# Patient Record
Sex: Female | Born: 1995 | Race: Black or African American | Hispanic: No | Marital: Single | Smoking: Never smoker
Health system: Southern US, Community
[De-identification: ages and names within clinical notes are randomized; demographics above are authoritative.]

## PROBLEM LIST (undated history)

## (undated) ENCOUNTER — Inpatient Hospital Stay (HOSPITAL_COMMUNITY): Payer: Self-pay

---

## 2017-10-24 ENCOUNTER — Encounter (HOSPITAL_COMMUNITY): Payer: Self-pay

## 2017-10-24 ENCOUNTER — Inpatient Hospital Stay (HOSPITAL_COMMUNITY)
Admission: AD | Admit: 2017-10-24 | Discharge: 2017-10-24 | Disposition: A | Discharge status: Home / Self Care | Payer: Self-pay | Source: Ambulatory Visit | Attending: Obstetrics & Gynecology | Admitting: Obstetrics & Gynecology

## 2017-10-24 ENCOUNTER — Inpatient Hospital Stay (HOSPITAL_COMMUNITY): Payer: Self-pay

## 2017-10-24 DIAGNOSIS — R109 Unspecified abdominal pain: Secondary | ICD-10-CM | POA: Insufficient documentation

## 2017-10-24 DIAGNOSIS — O4691 Antepartum hemorrhage, unspecified, first trimester: Secondary | ICD-10-CM | POA: Insufficient documentation

## 2017-10-24 DIAGNOSIS — Z3A00 Weeks of gestation of pregnancy not specified: Secondary | ICD-10-CM | POA: Insufficient documentation

## 2017-10-24 DIAGNOSIS — O4690 Antepartum hemorrhage, unspecified, unspecified trimester: Secondary | ICD-10-CM

## 2017-10-24 DIAGNOSIS — Z3A01 Less than 8 weeks gestation of pregnancy: Secondary | ICD-10-CM

## 2017-10-24 DIAGNOSIS — O26891 Other specified pregnancy related conditions, first trimester: Secondary | ICD-10-CM | POA: Insufficient documentation

## 2017-10-24 DIAGNOSIS — Z3491 Encounter for supervision of normal pregnancy, unspecified, first trimester: Secondary | ICD-10-CM

## 2017-10-24 DIAGNOSIS — O219 Vomiting of pregnancy, unspecified: Secondary | ICD-10-CM | POA: Insufficient documentation

## 2017-10-24 DIAGNOSIS — A599 Trichomoniasis, unspecified: Secondary | ICD-10-CM

## 2017-10-24 DIAGNOSIS — O98311 Other infections with a predominantly sexual mode of transmission complicating pregnancy, first trimester: Secondary | ICD-10-CM | POA: Insufficient documentation

## 2017-10-24 LAB — URINALYSIS, ROUTINE W REFLEX MICROSCOPIC
BILIRUBIN URINE: NEGATIVE
GLUCOSE, UA: NEGATIVE mg/dL
HGB URINE DIPSTICK: NEGATIVE
Ketones, ur: 20 mg/dL — AB
NITRITE: NEGATIVE
Protein, ur: 30 mg/dL — AB
Specific Gravity, Urine: 1.031 — ABNORMAL HIGH (ref 1.005–1.030)
pH: 6 (ref 5.0–8.0)

## 2017-10-24 LAB — WET PREP, GENITAL
Clue Cells Wet Prep HPF POC: NONE SEEN
Sperm: NONE SEEN
Yeast Wet Prep HPF POC: NONE SEEN

## 2017-10-24 LAB — CBC
HCT: 37.1 % (ref 36.0–46.0)
Hemoglobin: 12.5 g/dL (ref 12.0–15.0)
MCH: 25.8 pg — ABNORMAL LOW (ref 26.0–34.0)
MCHC: 33.7 g/dL (ref 30.0–36.0)
MCV: 76.7 fL — ABNORMAL LOW (ref 78.0–100.0)
Platelets: 208 10*3/uL (ref 150–400)
RBC: 4.84 MIL/uL (ref 3.87–5.11)
RDW: 14.4 % (ref 11.5–15.5)
WBC: 5.9 10*3/uL (ref 4.0–10.5)

## 2017-10-24 LAB — HCG, QUANTITATIVE, PREGNANCY: hCG, Beta Chain, Quant, S: 107820 m[IU]/mL — ABNORMAL HIGH (ref <5)

## 2017-10-24 LAB — ABO/RH: ABO/RH(D): B POS

## 2017-10-24 LAB — POCT PREGNANCY, URINE: PREG TEST UR: POSITIVE — AB

## 2017-10-24 MED ORDER — METRONIDAZOLE 500 MG PO TABS
2000.0000 mg | ORAL_TABLET | Freq: Once | ORAL | Status: AC
  Administered 2017-10-24: 2000 mg via ORAL
  Filled 2017-10-24: qty 4

## 2017-10-24 MED ORDER — PROMETHAZINE HCL 12.5 MG PO TABS: 12.5000 mg | ORAL_TABLET | Freq: Four times a day (QID) | ORAL | 1 refills | Status: AC | PRN

## 2017-10-24 NOTE — MAU Provider Note (Signed)
Chief Complaint: Abdominal Pain and Vaginal Bleeding   First Provider Initiated Contact with Patient 10/24/17 2050      SUBJECTIVE HPI: Analy Kirk is a 22 y.o. G2P0 at [redacted]w[redacted]d by LMP who presents to maternity admissions reporting vaginal bleeding and abdominal cramping. She reports abdominal pain started occurring 2 days ago as lower abdominal cramping that radiates to her back. She rates pain 8/10- has not taken any pain medication. She reports vaginal bleeding started occurring this morning and is associated with the abdominal pain. She reports heavy dark red vaginal bleeding for 2 hours. She denies vaginal bleeding currently since being in MAU. She reports seeing medium sizes clots present. She reports having a +HPT on 7/14 but was not having any cramping or bleeding when she had a +UPT. She denies vaginal itching/burning, urinary symptoms, h/a, dizziness, n/v, or fever/chills.    No past medical history on file. History reviewed. No pertinent surgical history. Social History   Socioeconomic History  . Marital status: Single    Spouse name: Not on file  . Number of children: Not on file  . Years of education: Not on file  . Highest education level: Not on file  Occupational History  . Not on file  Social Needs  . Financial resource strain: Not on file  . Food insecurity:    Worry: Not on file    Inability: Not on file  . Transportation needs:    Medical: Not on file    Non-medical: Not on file  Tobacco Use  . Smoking status: Not on file  Substance and Sexual Activity  . Alcohol use: Not on file  . Drug use: Not on file  . Sexual activity: Not on file  Lifestyle  . Physical activity:    Days per week: Not on file    Minutes per session: Not on file  . Stress: Not on file  Relationships  . Social connections:    Talks on phone: Not on file    Gets together: Not on file    Attends religious service: Not on file    Active member of club or organization: Not on file   Attends meetings of clubs or organizations: Not on file    Relationship status: Not on file  . Intimate partner violence:    Fear of current or ex partner: Not on file    Emotionally abused: Not on file    Physically abused: Not on file    Forced sexual activity: Not on file  Other Topics Concern  . Not on file  Social History Narrative  . Not on file   No current facility-administered medications on file prior to encounter.    No current outpatient medications on file prior to encounter.   Allergies not on file  ROS:  Review of Systems  Constitutional: Negative.   Respiratory: Negative.   Cardiovascular: Negative.   Gastrointestinal: Positive for abdominal pain. Negative for constipation, diarrhea, nausea and vomiting.  Genitourinary: Positive for vaginal bleeding. Negative for difficulty urinating, dysuria, frequency and urgency.  Neurological: Negative.    I have reviewed patient's Past Medical Hx, Surgical Hx, Family Hx, Social Hx, medications and allergies.   Physical Exam   Patient Vitals for the past 24 hrs:  BP Temp Temp src Pulse Resp Height Weight  10/24/17 2300 126/67 - - 85 - - -  10/24/17 1614 120/71 98.6 F (37 C) Oral 85 16 5\' 2"  (1.575 m) 174 lb (78.9 kg)   Constitutional: Well-developed, well-nourished  female in no acute distress.  Cardiovascular: normal rate Respiratory: normal effort GI: Abd soft, non-tender. Pos BS x 4 MS: Extremities nontender, no edema, normal ROM Neurologic: Alert and oriented x 4.  GU: Neg CVAT.  PELVIC EXAM: Cervix pink, visually closed, without lesion,NO  VAGINAL BLEEDING, moderate white milky discharge, vaginal walls and external genitalia normal Bimanual exam: Cervix 0/long/high, firm, anterior, neg CMT, uterus nontender, nonenlarged, adnexa without tenderness, enlargement, or mass  LAB RESULTS Results for orders placed or performed during the hospital encounter of 10/24/17 (from the past 24 hour(s))  Urinalysis, Routine w  reflex microscopic     Status: Abnormal   Collection Time: 10/24/17  5:06 PM  Result Value Ref Range   Color, Urine YELLOW YELLOW   APPearance HAZY (A) CLEAR   Specific Gravity, Urine 1.031 (H) 1.005 - 1.030   pH 6.0 5.0 - 8.0   Glucose, UA NEGATIVE NEGATIVE mg/dL   Hgb urine dipstick NEGATIVE NEGATIVE   Bilirubin Urine NEGATIVE NEGATIVE   Ketones, ur 20 (A) NEGATIVE mg/dL   Protein, ur 30 (A) NEGATIVE mg/dL   Nitrite NEGATIVE NEGATIVE   Leukocytes, UA MODERATE (A) NEGATIVE   RBC / HPF 6-10 0 - 5 RBC/hpf   WBC, UA >50 (H) 0 - 5 WBC/hpf   Bacteria, UA RARE (A) NONE SEEN   Squamous Epithelial / LPF 0-5 0 - 5   Mucus PRESENT   Pregnancy, urine POC     Status: Abnormal   Collection Time: 10/24/17  5:15 PM  Result Value Ref Range   Preg Test, Ur POSITIVE (A) NEGATIVE  CBC     Status: Abnormal   Collection Time: 10/24/17  8:58 PM  Result Value Ref Range   WBC 5.9 4.0 - 10.5 K/uL   RBC 4.84 3.87 - 5.11 MIL/uL   Hemoglobin 12.5 12.0 - 15.0 g/dL   HCT 16.137.1 09.636.0 - 04.546.0 %   MCV 76.7 (L) 78.0 - 100.0 fL   MCH 25.8 (L) 26.0 - 34.0 pg   MCHC 33.7 30.0 - 36.0 g/dL   RDW 40.914.4 81.111.5 - 91.415.5 %   Platelets 208 150 - 400 K/uL  ABO/Rh     Status: None   Collection Time: 10/24/17  8:58 PM  Result Value Ref Range   ABO/RH(D)      B POS Performed at [facility], [address] [place], [place] [address]   hCG, quantitative, pregnancy     Status: Abnormal   Collection Time: 10/24/17  8:58 PM  Result Value Ref Range   hCG, Beta Chain, Quant, S 107,820 (H) <5 mIU/mL  Wet prep, genital     Status: Abnormal   Collection Time: 10/24/17 10:04 PM  Result Value Ref Range   Yeast Wet Prep HPF POC NONE SEEN NONE SEEN   Trich, Wet Prep PRESENT (A) NONE SEEN   Clue Cells Wet Prep HPF POC NONE SEEN NONE SEEN   WBC, Wet Prep HPF POC MODERATE (A) NONE SEEN   Sperm NONE SEEN    IMAGING [place] Ob Less Than 14 Weeks With Ob Transvaginal  Result Date: 10/24/2017 CLINICAL DATA:  22 year old pregnant  female with acute pelvic pain and bleeding today. EXAM: OBSTETRIC <14 WK US AND TRANSVAGINAL OB US TECHNIQUE: Both transabdominal and transvaginal ultrasound examinations were performed for complete evaluation of the gestation as well as the maternal uterus, adnexal regions, and pelvic cul-de-sac. Transvaginal technique was performed to assess early pregnancy. COMPARISON:  None. FINDINGS: Intrauterine gestational sac: Single Yolk sac:  Visualized. Embryo:  Visualized. Cardiac Activity: Visualized. Heart Rate: 114 bpm CRL:  5.6 mm   6 w   2 d                  [place] EDC: 06/17/2018 Subchorionic hemorrhage:  None visualized. Maternal uterus/adnexae: The ovaries are unremarkable except for RIGHT corpus luteum cyst. A trace amount of free pelvic fluid noted. IMPRESSION: Single living intrauterine gestation with estimated gestational age of [redacted] weeks 2 days by this ultrasound. No evidence of subchorionic hemorrhage. Electronically Signed   By: Harmon Pier M.D.   On: 10/24/2017 21:31    MAU Management/MDM: Orders Placed This Encounter  Procedures  . Wet prep, genital  . US OB LESS THAN 14 WEEKS WITH OB TRANSVAGINAL  . Urinalysis, Routine w reflex microscopic  . CBC  . hCG, quantitative, pregnancy  . Pregnancy, urine POC  . ABO/Rh   Wet prep-positive for trich, treatment with Flagyl given in MAU ABO/Rh- B positive, no RhoGam needed GC/C-pending  Meds ordered this encounter  Medications  . metroNIDAZOLE (FLAGYL) tablet 2,000 mg  . promethazine (PHENERGAN) 12.5 MG tablet    Sig: Take 1 tablet (12.5 mg total) by mouth every 6 (six) hours as needed for nausea or vomiting.    Dispense:  30 tablet    Refill:  1    Order Specific Question:   Supervising Provider    Answer:   Willodean Rosenthal [[id number]]   Treatments in MAU included 2000 mg Flagyl for treatment of trich.  Patient reports nausea after medication.  Rx sent to pharmacy of choice for Phenergan.  Discussed importance of partner getting treatment  for STD.  Patient reports partner is living in [place] and she is unable to get prescription to him.  Discussed with patient need to inform partner of STD and refer him to local health department to receive treatment.  Educated on not having intercourse for the next 10 to 14 days after everyone is treated.  Patient verbalizes understanding. pt discharged .  Patient stable at time of discharge.  Discussed reasons to return to MAU.   ASSESSMENT 1. Trichimoniasis   2. Vaginal bleeding during pregnancy   3. Normal IUP (intrauterine pregnancy) on prenatal ultrasound, first trimester   4. [redacted] weeks gestation of pregnancy   5. Abdominal pain during pregnancy in first trimester   6. Nausea and vomiting during pregnancy     PLAN Discharge home Make appointment to be seen to initiate prenatal care List of OB providers in [place] given Notify partner of need for treatment for STD Return to MAU as needed Rx for Phenergan sent to pharmacy of choice   Allergies as of 10/24/2017   No Known Allergies     Medication List    TAKE these medications   promethazine 12.5 MG tablet Commonly known as:  PHENERGAN Take 1 tablet (12.5 mg total) by mouth every 6 (six) hours as needed for nausea or vomiting.      Steward Drone  Certified Nurse-Midwife 10/25/2017  7:05 AM

## 2017-10-24 NOTE — MAU Note (Signed)
G1P0 at unknown GA presents to MAU with bleeding starting this AM.

## 2017-10-24 NOTE — MAU Note (Signed)
Pt states she woke up this morning, passed blood clots in toilet, is cramping.  Pos HPT & pos blood test at a lab. (?)

## 2017-10-25 LAB — GC/CHLAMYDIA PROBE AMP (~~LOC~~) NOT AT ARMC
Chlamydia: NEGATIVE
Neisseria Gonorrhea: NEGATIVE

## 2018-05-15 ENCOUNTER — Other Ambulatory Visit: Payer: Self-pay

## 2018-05-15 ENCOUNTER — Emergency Department (HOSPITAL_BASED_OUTPATIENT_CLINIC_OR_DEPARTMENT_OTHER)
Admission: EM | Admit: 2018-05-15 | Discharge: 2018-05-15 | Disposition: A | Discharge status: Home / Self Care | Payer: BLUE CROSS/BLUE SHIELD | Attending: Emergency Medicine | Admitting: Emergency Medicine

## 2018-05-15 ENCOUNTER — Emergency Department (HOSPITAL_BASED_OUTPATIENT_CLINIC_OR_DEPARTMENT_OTHER): Payer: BLUE CROSS/BLUE SHIELD

## 2018-05-15 ENCOUNTER — Encounter (HOSPITAL_BASED_OUTPATIENT_CLINIC_OR_DEPARTMENT_OTHER): Payer: Self-pay | Admitting: *Deleted

## 2018-05-15 DIAGNOSIS — R1030 Lower abdominal pain, unspecified: Secondary | ICD-10-CM | POA: Diagnosis not present

## 2018-05-15 DIAGNOSIS — Z3A08 8 weeks gestation of pregnancy: Secondary | ICD-10-CM | POA: Insufficient documentation

## 2018-05-15 DIAGNOSIS — O218 Other vomiting complicating pregnancy: Secondary | ICD-10-CM | POA: Insufficient documentation

## 2018-05-15 DIAGNOSIS — O219 Vomiting of pregnancy, unspecified: Secondary | ICD-10-CM

## 2018-05-15 DIAGNOSIS — N898 Other specified noninflammatory disorders of vagina: Secondary | ICD-10-CM | POA: Diagnosis not present

## 2018-05-15 DIAGNOSIS — O9989 Other specified diseases and conditions complicating pregnancy, childbirth and the puerperium: Secondary | ICD-10-CM | POA: Diagnosis present

## 2018-05-15 LAB — URINALYSIS, ROUTINE W REFLEX MICROSCOPIC
Bilirubin Urine: NEGATIVE
GLUCOSE, UA: NEGATIVE mg/dL
Hgb urine dipstick: NEGATIVE
LEUKOCYTE UA: NEGATIVE
Nitrite: NEGATIVE
PH: 6 (ref 5.0–8.0)
Protein, ur: 30 mg/dL — AB
Specific Gravity, Urine: >1.03 — ABNORMAL HIGH (ref 1.005–1.030)

## 2018-05-15 LAB — COMPREHENSIVE METABOLIC PANEL
ALBUMIN: 4.5 g/dL (ref 3.5–5.0)
ALK PHOS: 51 U/L (ref 38–126)
ALT: 17 U/L (ref 0–44)
AST: 24 U/L (ref 15–41)
Anion gap: 13 (ref 5–15)
BILIRUBIN TOTAL: 0.5 mg/dL (ref 0.3–1.2)
BUN: 10 mg/dL (ref 6–20)
CALCIUM: 9.3 mg/dL (ref 8.9–10.3)
CO2: 21 mmol/L — ABNORMAL LOW (ref 22–32)
CREATININE: 0.56 mg/dL (ref 0.44–1.00)
Chloride: 97 mmol/L — ABNORMAL LOW (ref 98–111)
GFR calc Af Amer: >60 mL/min (ref >60)
GLUCOSE: 92 mg/dL (ref 70–99)
POTASSIUM: 3.3 mmol/L — AB (ref 3.5–5.1)
Sodium: 131 mmol/L — ABNORMAL LOW (ref 135–145)
TOTAL PROTEIN: 8.5 g/dL — AB (ref 6.5–8.1)

## 2018-05-15 LAB — CBC WITH DIFFERENTIAL/PLATELET
Abs Immature Granulocytes: 0.02 10*3/uL (ref 0.00–0.07)
BASOS PCT: 0 %
Basophils Absolute: 0 10*3/uL (ref 0.0–0.1)
EOS ABS: 0 10*3/uL (ref 0.0–0.5)
Eosinophils Relative: 0 %
HEMATOCRIT: 39.1 % (ref 36.0–46.0)
Hemoglobin: 12.5 g/dL (ref 12.0–15.0)
IMMATURE GRANULOCYTES: 0 %
Lymphocytes Relative: 20 %
Lymphs Abs: 0.9 10*3/uL (ref 0.7–4.0)
MCH: 24.6 pg — ABNORMAL LOW (ref 26.0–34.0)
MCHC: 32 g/dL (ref 30.0–36.0)
MCV: 77 fL — AB (ref 80.0–100.0)
MONO ABS: 0.5 10*3/uL (ref 0.1–1.0)
MONOS PCT: 10 %
NEUTROS PCT: 70 %
Neutro Abs: 3.1 10*3/uL (ref 1.7–7.7)
Platelets: 239 10*3/uL (ref 150–400)
RBC: 5.08 MIL/uL (ref 3.87–5.11)
RDW: 13.4 % (ref 11.5–15.5)
WBC: 4.5 10*3/uL (ref 4.0–10.5)
nRBC: 0 % (ref 0.0–0.2)

## 2018-05-15 LAB — URINALYSIS, MICROSCOPIC (REFLEX)

## 2018-05-15 LAB — WET PREP, GENITAL
Clue Cells Wet Prep HPF POC: NONE SEEN
SPERM: NONE SEEN
TRICH WET PREP: NONE SEEN
YEAST WET PREP: NONE SEEN

## 2018-05-15 LAB — LIPASE, BLOOD: LIPASE: 35 U/L (ref 11–51)

## 2018-05-15 LAB — HCG, QUANTITATIVE, PREGNANCY: HCG, BETA CHAIN, QUANT, S: 267868 m[IU]/mL — AB (ref <5)

## 2018-05-15 MED ORDER — ONDANSETRON HCL 4 MG/2ML IJ SOLN
4.0000 mg | Freq: Once | INTRAMUSCULAR | Status: AC
  Administered 2018-05-15: 4 mg via INTRAVENOUS
  Filled 2018-05-15: qty 2

## 2018-05-15 MED ORDER — SODIUM CHLORIDE 0.9 % IV BOLUS
1000.0000 mL | Freq: Once | INTRAVENOUS | Status: AC
  Administered 2018-05-15: 1000 mL via INTRAVENOUS

## 2018-05-15 MED ORDER — DOXYLAMINE-PYRIDOXINE 10-10 MG PO TBEC: 1.0000 | DELAYED_RELEASE_TABLET | Freq: Three times a day (TID) | ORAL | 0 refills | Status: AC | PRN

## 2018-05-15 NOTE — ED Triage Notes (Signed)
Vomiting x 2 weeks. She is [redacted] weeks pregnant. She is taking prenatal vitamins. She is scheduled for an [place] in 2 days. Denies abdominal pain.

## 2018-05-15 NOTE — ED Provider Notes (Signed)
Emergency Department Provider Note   I have reviewed the triage vital signs and the nursing notes.   HISTORY  Chief Complaint Emesis During Pregnancy   HPI Vicki Kirk is a 23 y.o. female presents to the emergency department for evaluation of nausea and vomiting in early pregnancy.  Patient has had 2 weeks of symptoms.  She is G2 P0 at approximately 8 weeks by dates.  She terminated her last pregnancy. She has an appointment scheduled with OB but has not seen a provider as of yet.  She denies any vaginal bleeding but has had some increased discharge which she describes as white and thick.  She describes "hunger pains" but denies specific lower abdominal discomfort.  No dysuria, hesitancy, urgency.  No back pain.  No diarrhea.  No radiation of symptoms or modifying factors.  History reviewed. No pertinent past medical history.  There are no active problems to display for this patient.   History reviewed. No pertinent surgical history.  Allergies Bactrim [sulfamethoxazole-trimethoprim]  No family history on file.  Social History Social History   Tobacco Use  . Smoking status: Never Smoker  . Smokeless tobacco: Never Used  Substance Use Topics  . Alcohol use: Not Currently  . Drug use: Not Currently    Review of Systems  Constitutional: No fever/chills Eyes: No visual changes. ENT: No sore throat. Cardiovascular: Denies chest pain. Respiratory: Denies shortness of breath. Gastrointestinal: Positive lower abdominal pain.  Positive nausea and vomiting.  No diarrhea.  No constipation. Genitourinary: Negative for dysuria. Musculoskeletal: Negative for back pain. Skin: Negative for rash. Neurological: Negative for headaches, focal weakness or numbness.  10-point ROS otherwise negative.  ____________________________________________   PHYSICAL EXAM:  VITAL SIGNS: ED Triage Vitals  Enc Vitals Group     BP 05/15/18 1325 126/69     Pulse Rate 05/15/18 1325 84      Resp 05/15/18 1325 16     Temp 05/15/18 1325 98.1 F (36.7 C)     Temp Source 05/15/18 1325 Oral     SpO2 05/15/18 1325 100 %     Weight 05/15/18 1327 175 lb (79.4 kg)     Height 05/15/18 1327 5\' 2"  (1.575 m)   Constitutional: Alert and oriented. Well appearing and in no acute distress. Eyes: Conjunctivae are normal. Head: Atraumatic. Nose: No congestion/rhinnorhea. Mouth/Throat: Mucous membranes are moist.  Neck: No stridor.   Cardiovascular: Normal rate, regular rhythm. Good peripheral circulation. Grossly normal heart sounds.   Respiratory: Normal respiratory effort.  No retractions. Lungs CTAB. Gastrointestinal: Soft and nontender. No distention.  Genitourinary: Exam performed with verbal consent and chaperone present. Mild discharge. Normal appearing, closed cervix. No bleeding appreciated.  Musculoskeletal: No lower extremity tenderness nor edema. No gross deformities of extremities. Neurologic:  Normal speech and language. No gross focal neurologic deficits are appreciated.  Skin:  Skin is warm, dry and intact. No rash noted.  ____________________________________________   LABS (all labs ordered are listed, but only abnormal results are displayed)  Labs Reviewed  WET PREP, GENITAL - Abnormal; Notable for the following components:      Result Value   WBC, Wet Prep HPF POC MODERATE (*)    All other components within normal limits  COMPREHENSIVE METABOLIC PANEL - Abnormal; Notable for the following components:   Sodium 131 (*)    Potassium 3.3 (*)    Chloride 97 (*)    CO2 21 (*)    Total Protein 8.5 (*)    All other components within  normal limits  CBC WITH DIFFERENTIAL/PLATELET - Abnormal; Notable for the following components:   MCV 77.0 (*)    MCH 24.6 (*)    All other components within normal limits  URINALYSIS, ROUTINE W REFLEX MICROSCOPIC - Abnormal; Notable for the following components:   APPearance HAZY (*)    Specific Gravity, Urine >1.030 (*)     Ketones, ur >80 (*)    Protein, ur 30 (*)    All other components within normal limits  HCG, QUANTITATIVE, PREGNANCY - Abnormal; Notable for the following components:   hCG, Beta Francene Finders 789,381 (*)    All other components within normal limits  URINALYSIS, MICROSCOPIC (REFLEX) - Abnormal; Notable for the following components:   Bacteria, UA RARE (*)    All other components within normal limits  LIPASE, BLOOD  GC/CHLAMYDIA PROBE AMP ([place]) NOT AT [facility]   ____________________________________________  RADIOLOGY  US Ob Comp < 14 Wks  Result Date: 05/15/2018 CLINICAL DATA:  Initial evaluation for acute vomiting, right lower quadrant tenderness, early pregnancy. EXAM: OBSTETRIC <14 WK ULTRASOUND TECHNIQUE: Transabdominal ultrasound was performed for evaluation of the gestation as well as the maternal uterus and adnexal regions. COMPARISON:  None available. FINDINGS: Intrauterine gestational sac: Single Yolk sac:  Present Embryo:  Present Cardiac Activity: Present Heart Rate: 168 bpm CRL: 15.8 mm   8 w 0 d                  [place] EDC: 12/25/2018 Subchorionic hemorrhage:  None visualized. Maternal uterus/adnexae: Left ovary normal in appearance. Corpus luteal cyst noted within the right ovary. No free fluid within the pelvis. IMPRESSION: 1. Single viable intrauterine pregnancy as above without complication, estimated gestational age [redacted] weeks and 0 days by crown-rump length, with ultrasound EDC of 12/25/2018. 2. Small right ovarian corpus luteal cyst, which could contribute to acute right lower quadrant pain. No other acute maternal uterine or adnexal abnormality identified. Electronically Signed   By: Rise Mu M.D.   On: 05/15/2018 17:46    ____________________________________________   PROCEDURES  Procedure(s) performed:   Procedures  None ____________________________________________   INITIAL IMPRESSION / ASSESSMENT AND PLAN / ED COURSE  Pertinent labs & imaging  results that were available during my care of the patient were reviewed by me and considered in my medical decision making (see chart for details).  Patient presents with nausea, vomiting for the past 2 weeks.  No diarrhea.  Patient is well-appearing.  She does have some mild right lower quadrant tenderness on my abdominal exam although is not complaining of specific abdominal pain.  Plan for pelvic exam given her report of increased discharge.  Will obtain screening labs including beta quant and perform ultrasound to establish intrauterine pregnancy.   IUP on TVUS. Patient feeling improved after Zofran and IVF. BP normal. Discharged home with Diclegis and discussed that this is a combination of OTC meds if Rx is expensive. Advised very close OB follow up.  ____________________________________________  FINAL CLINICAL IMPRESSION(S) / ED DIAGNOSES  Final diagnoses:  Nausea and vomiting in pregnancy  Lower abdominal pain     MEDICATIONS GIVEN DURING THIS VISIT:  Medications  sodium chloride 0.9 % bolus 1,000 mL ( Intravenous Stopped 05/15/18 1708)  ondansetron (ZOFRAN) injection 4 mg (4 mg Intravenous Given 05/15/18 1537)     NEW OUTPATIENT MEDICATIONS STARTED DURING THIS VISIT:  Discharge Medication List as of 05/15/2018  6:44 PM    START taking these medications   Details  Doxylamine-Pyridoxine  10-10 MG TBEC Take 1 tablet by mouth every 8 (eight) hours as needed (nausea and vomiting)., Starting Mon 05/15/2018, Print        Note:  This document was prepared using Dragon voice recognition software and may include unintentional dictation errors.  Alona Bene, MD Emergency Medicine    Long, Arlyss Repress, MD 05/16/18 [phone]

## 2018-05-15 NOTE — Discharge Instructions (Signed)
You have been seen in the Emergency Department (ED) today for nausea and vomiting.  Your work up today has not shown a clear cause for your symptoms. ?You have been prescribed Diclegis; please use as prescribed as needed for your nausea. ? ?Follow up with your doctor as soon as possible regarding today?s emergent visit and your symptoms of nausea.  ? ?Return to the ED if you develop abdominal, bloody vomiting, bloody diarrhea, if you are unable to tolerate fluids due to vomiting, or if you develop other symptoms that concern you. ? ?

## 2018-05-17 LAB — GC/CHLAMYDIA PROBE AMP (~~LOC~~) NOT AT ARMC
CHLAMYDIA, DNA PROBE: NEGATIVE
NEISSERIA GONORRHEA: NEGATIVE

## 2018-09-06 ENCOUNTER — Encounter (HOSPITAL_COMMUNITY): Payer: Self-pay

## 2019-07-19 IMAGING — US US OB COMP LESS 14 WK
1 series · 14 of 19 positions shown · non-contrast
Comparison: None available.

CLINICAL DATA: Initial evaluation for acute vomiting, right lower
quadrant tenderness, early pregnancy.

EXAM:
OBSTETRIC <14 WK ULTRASOUND
TECHNIQUE: Transabdominal ultrasound was performed for evaluation of the
gestation as well as the maternal uterus and adnexal regions.

[Series 1: us ob comp less 14 wk · 0.09mm/px · 19 acquisitions, 14 frames shown]
[im 1/19]
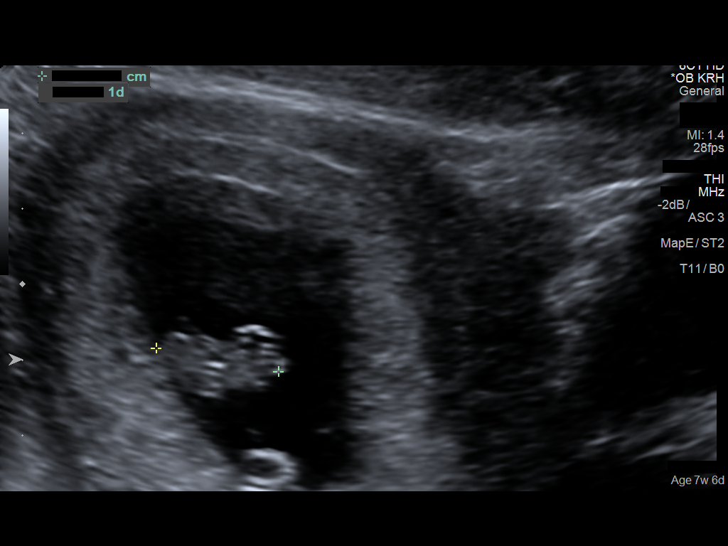
[im 3/19]
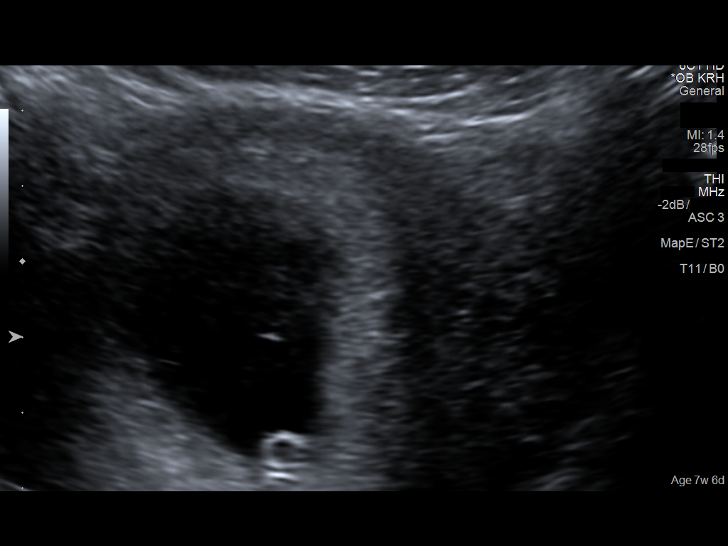
[im 4/19]
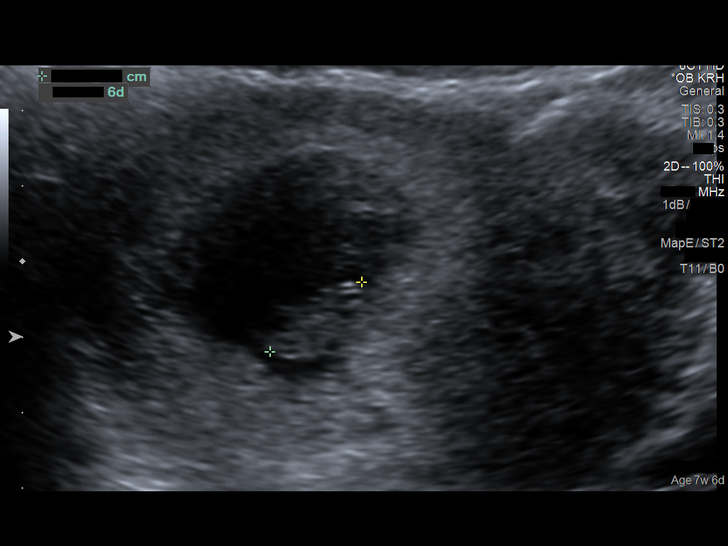
[im 5/19]
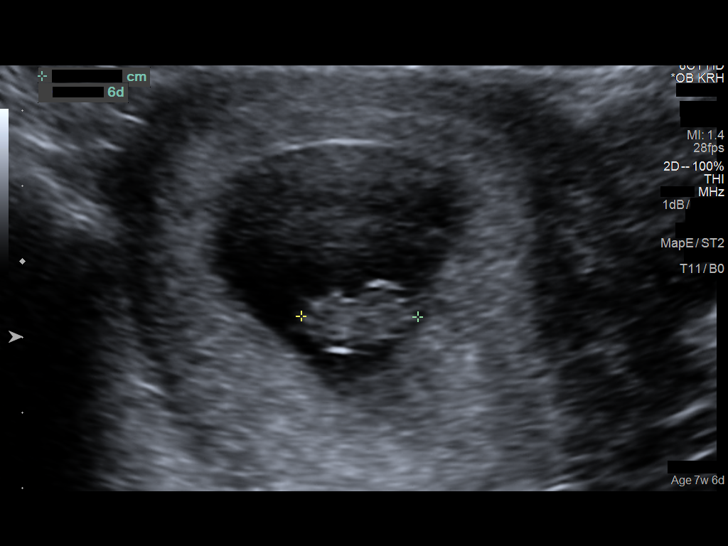
[im 7/19]
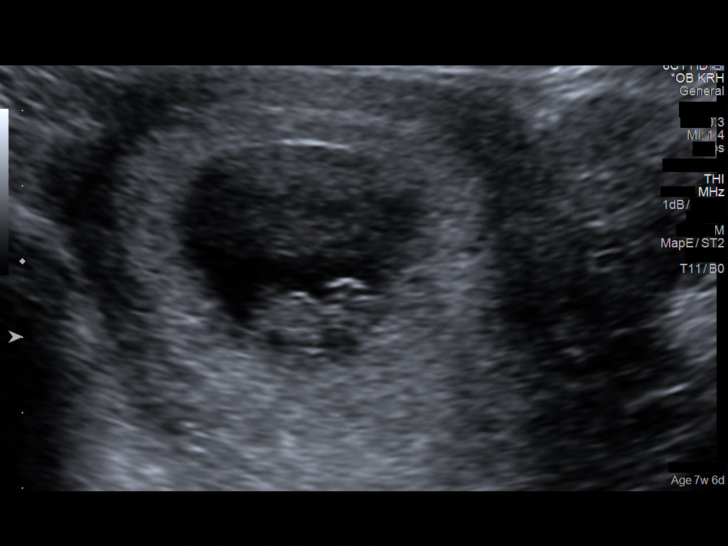
[im 8/19]
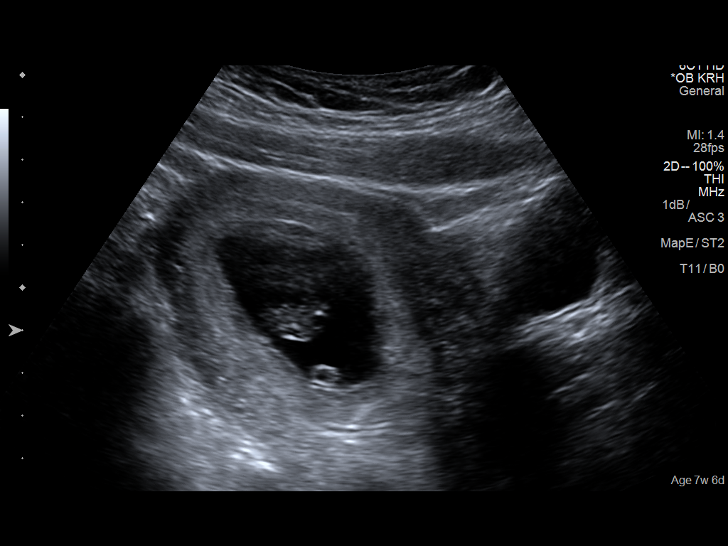
[im 9/19]
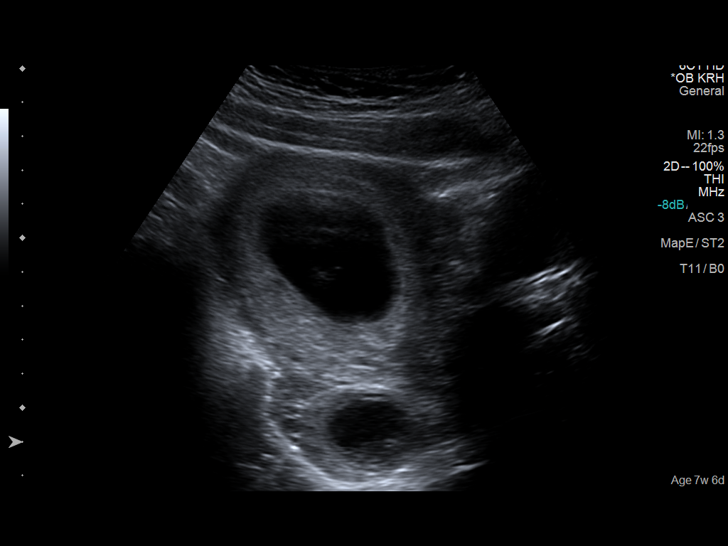
[im 11/19]
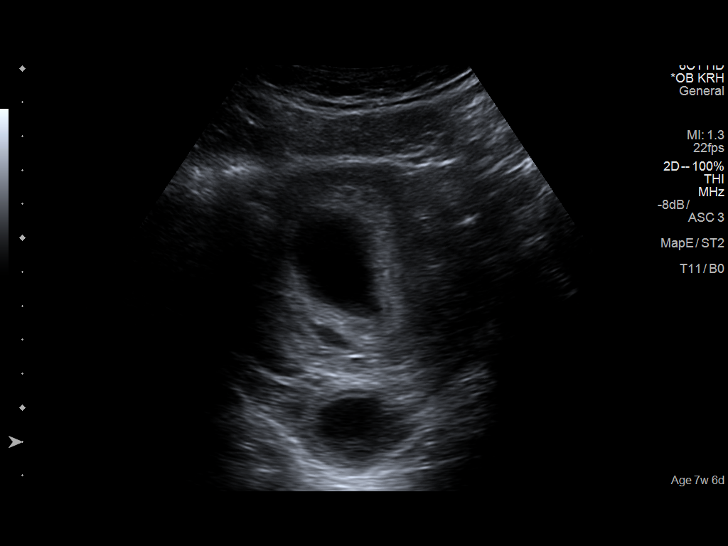
[im 12/19]
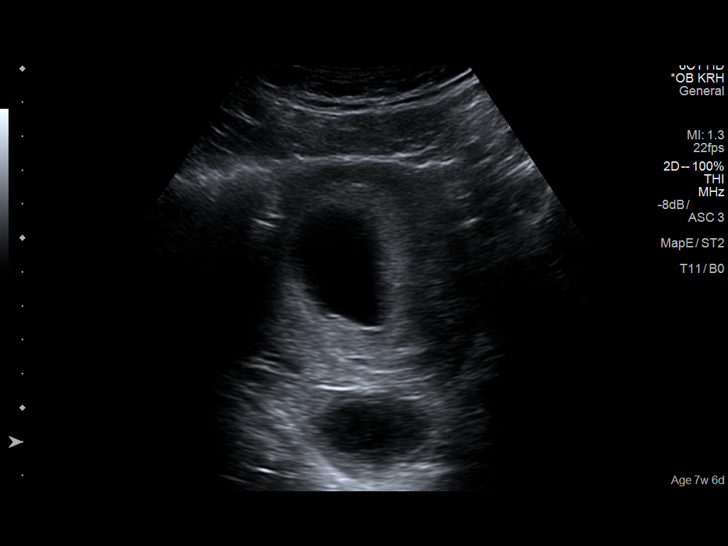
[im 13/19]
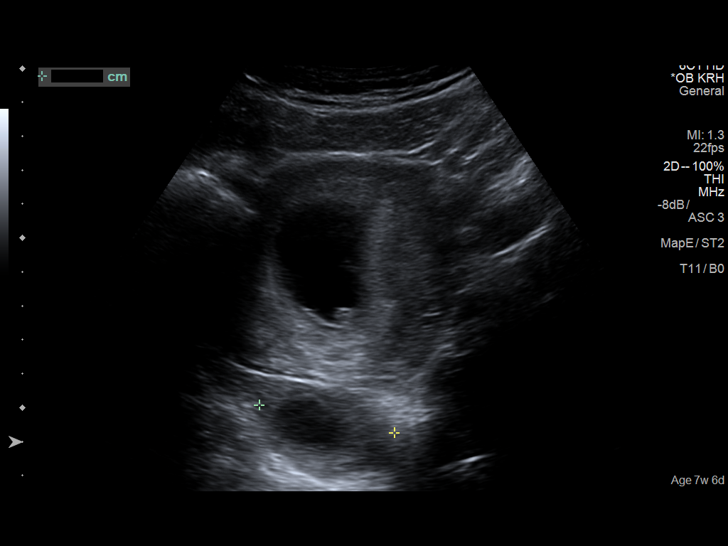
[im 15/19]
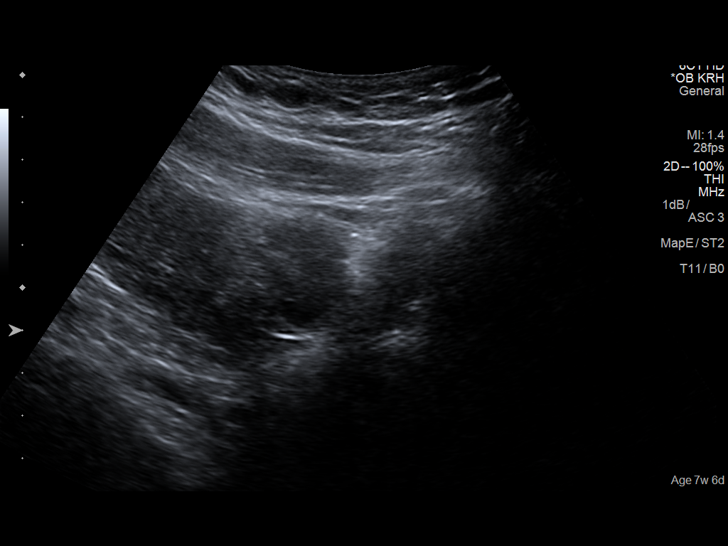
[im 16/19]
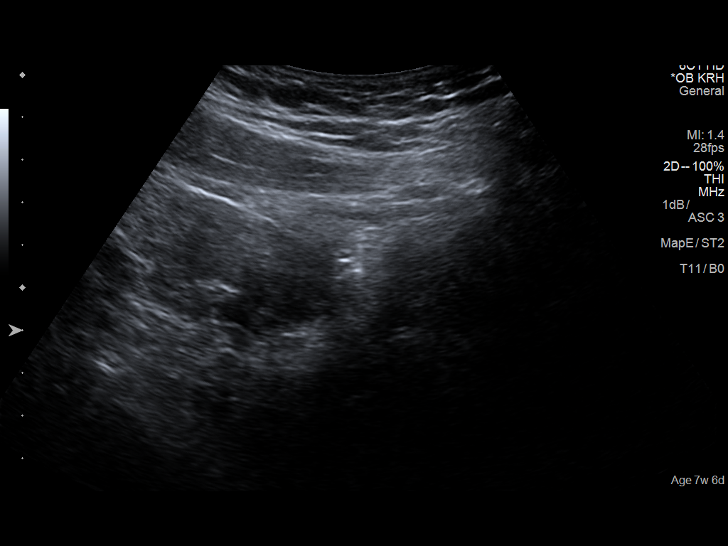
[im 17/19]
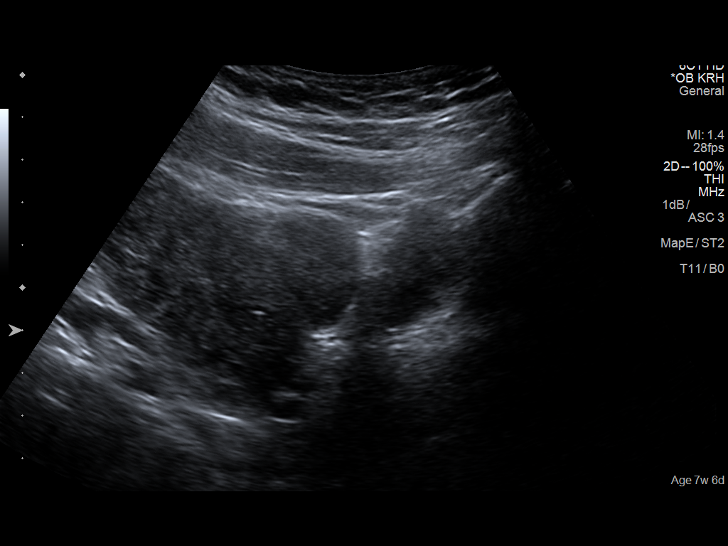
[im 19/19]
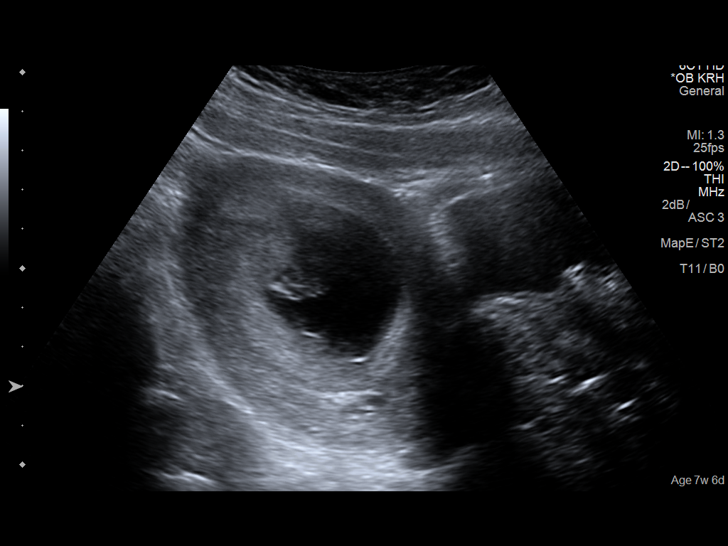

[14 of 19 positions shown; findings below may reference images not displayed]

FINDINGS: Intrauterine gestational sac: Single

Yolk sac:  Present

Embryo:  Present

Cardiac Activity: Present

Heart Rate: 168 bpm

CRL: 15.8 mm   8 w 0 d                  US EDC: 12/25/2018

Subchorionic hemorrhage:  None visualized.

Maternal uterus/adnexae: Left ovary normal in appearance. Corpus
luteal cyst noted within the right ovary. No free fluid within the
pelvis.
IMPRESSION: 1. Single viable intrauterine pregnancy as above without
complication, estimated gestational age 8 weeks and 0 days by
crown-rump length, with ultrasound EDC of 12/25/2018.
2. Small right ovarian corpus luteal cyst, which could contribute to
acute right lower quadrant pain. No other acute maternal uterine or
adnexal abnormality identified.

## 2019-09-09 IMAGING — US US OB < 14 WEEKS - US OB TV
1 series · 15 of 28 positions shown · non-contrast
Comparison: None.

CLINICAL DATA: 22-year-old pregnant female with acute pelvic pain
and bleeding today.

EXAM:
OBSTETRIC <14 WK US AND TRANSVAGINAL OB US
TECHNIQUE: Both transabdominal and transvaginal ultrasound examinations were
performed for complete evaluation of the gestation as well as the
maternal uterus, adnexal regions, and pelvic cul-de-sac.
Transvaginal technique was performed to assess early pregnancy.

[Series 1: us ob < 14 weeks - us ob tv · 15 of 54 slices shown]
[im 1/54]
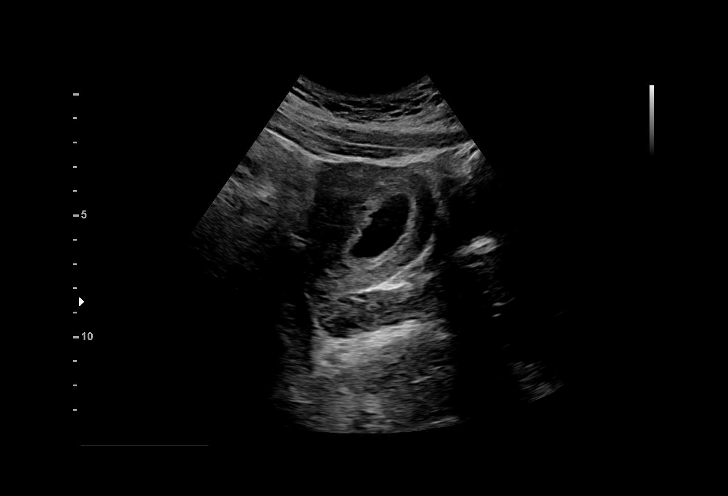
[im 4/54]
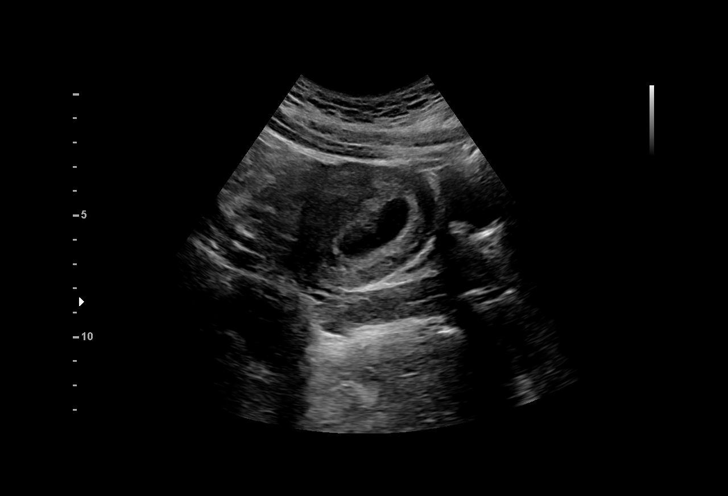
[im 8/54]
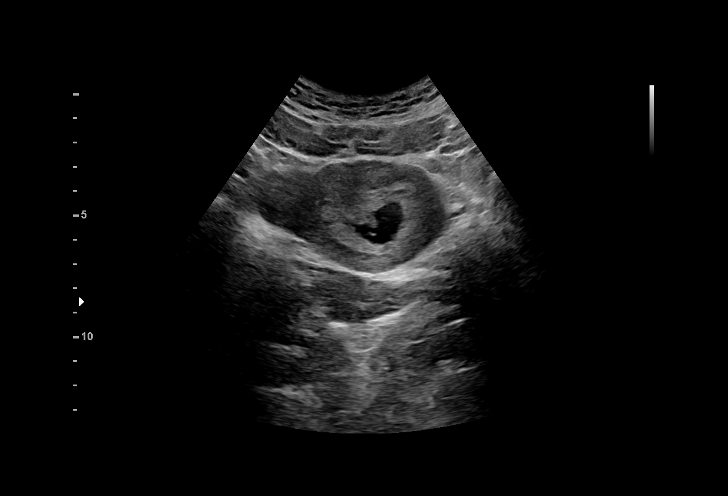
[im 12/54]
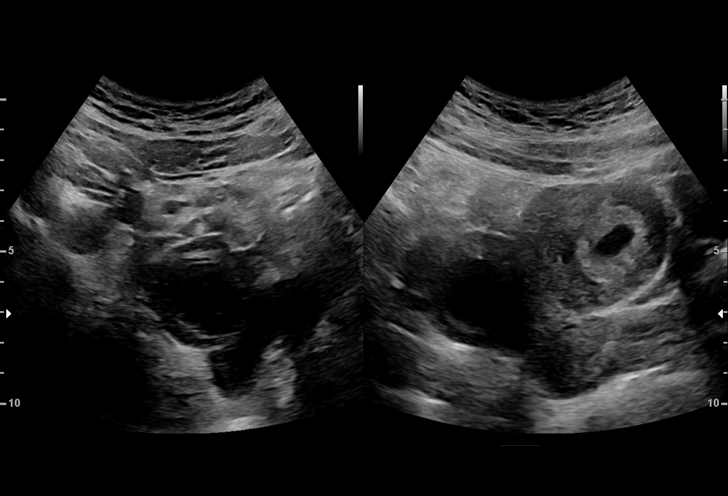
[im 16/54]
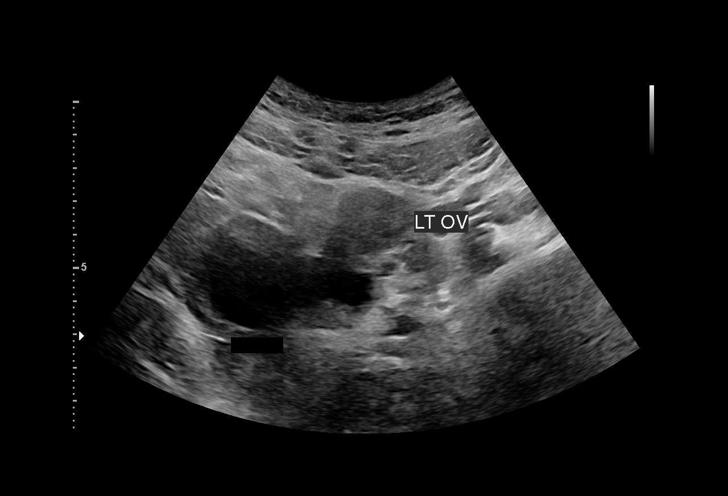
[im 20/54]
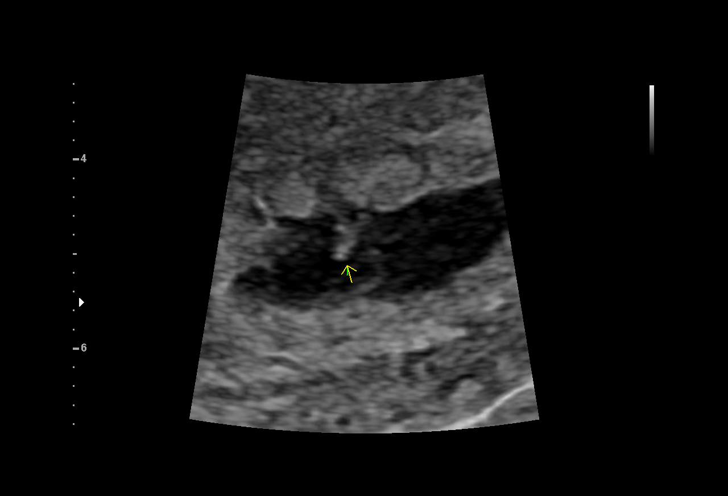
[im 24/54]
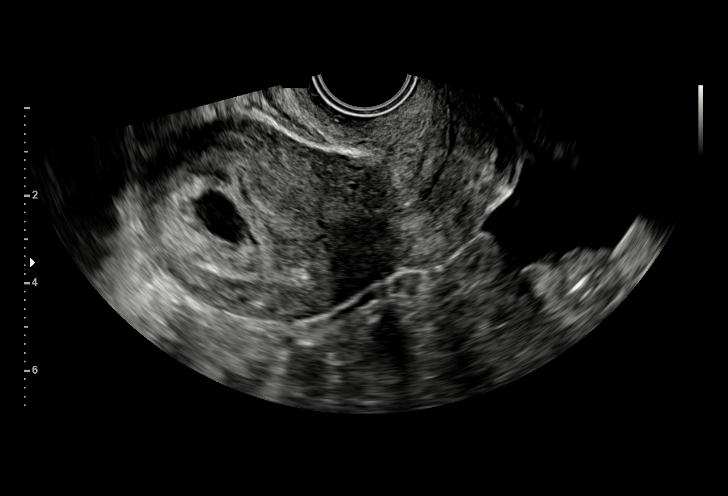
[im 28/54]
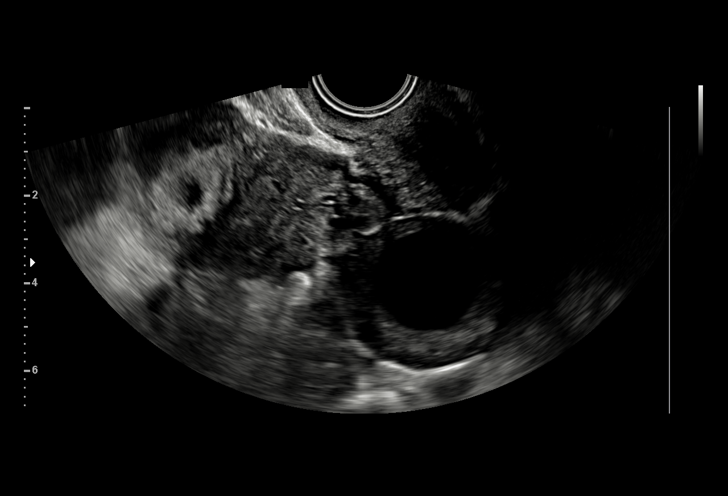
[im 30/54]
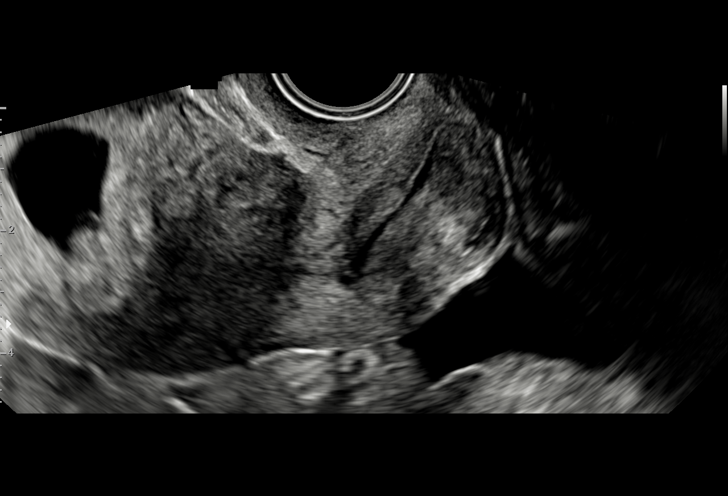
[im 34/54]
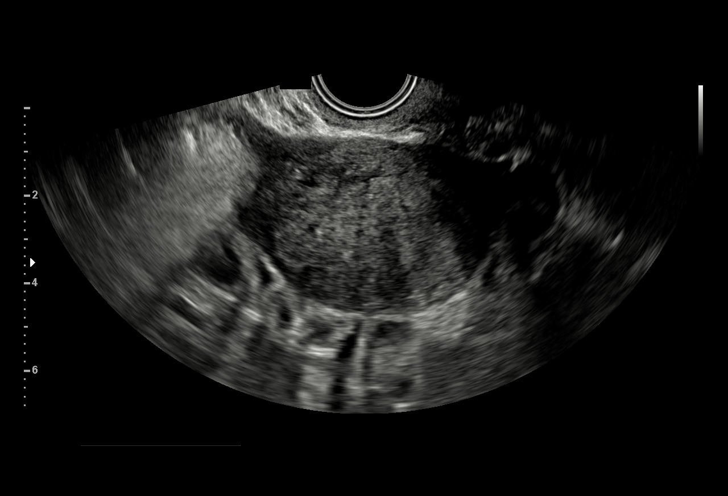
[im 38/54]
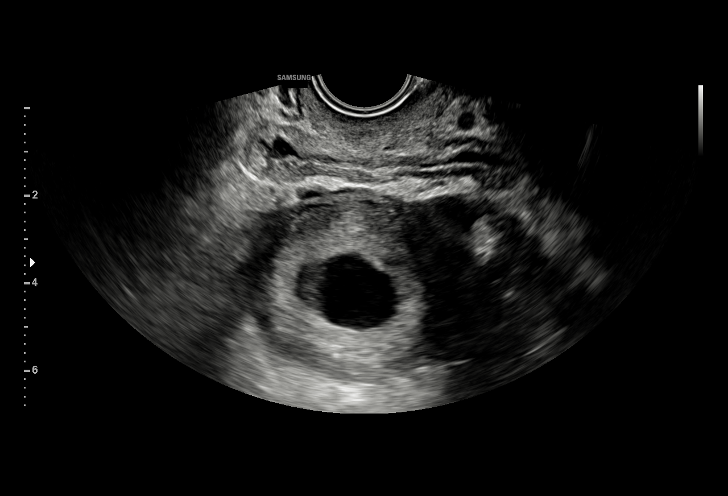
[im 42/54]
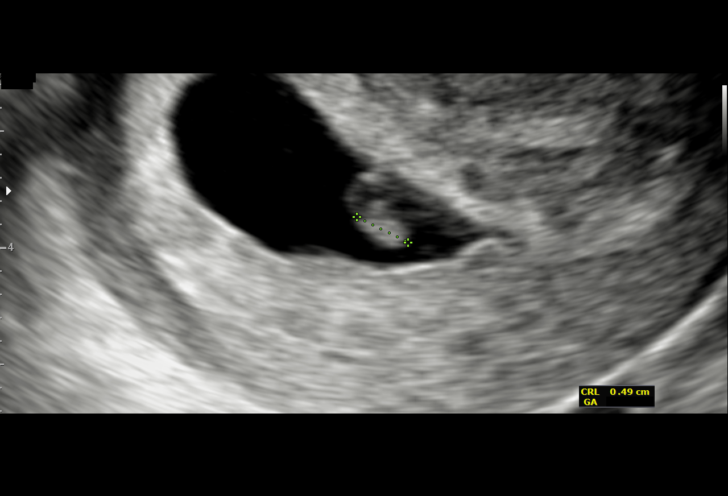
[im 46/54]
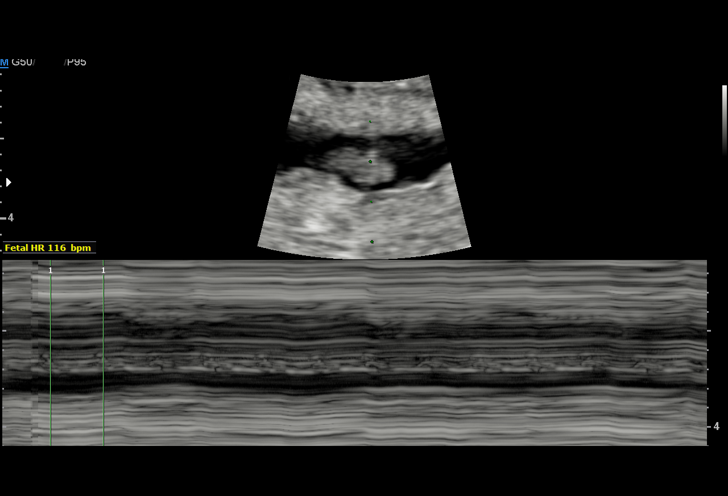
[im 50/54]
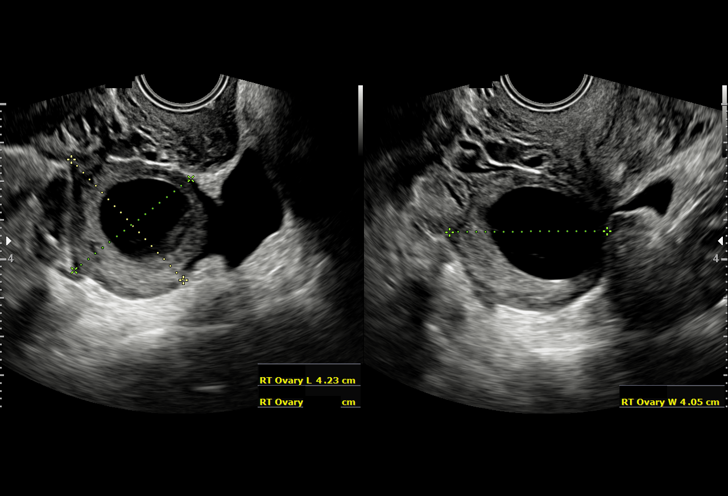
[im 54/54]
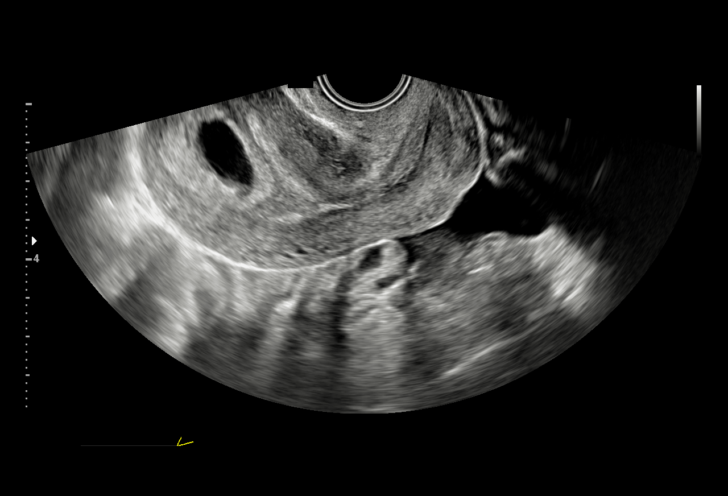

[15 of 28 positions shown; findings below may reference images not displayed]

FINDINGS: Intrauterine gestational sac: Single

Yolk sac:  Visualized.

Embryo:  Visualized.

Cardiac Activity: Visualized.

Heart Rate: 114 bpm

CRL:  5.6 mm   6 w   2 d                  US EDC: 06/17/2018

Subchorionic hemorrhage:  None visualized.

Maternal uterus/adnexae: The ovaries are unremarkable except for
RIGHT corpus luteum cyst.

A trace amount of free pelvic fluid noted.
IMPRESSION: Single living intrauterine gestation with estimated gestational age
of 6 weeks 2 days by this ultrasound.

No evidence of subchorionic hemorrhage.
